# Patient Record
Sex: Male | Born: 1990 | Race: Black or African American | Hispanic: No | Marital: Single | Smoking: Never smoker
Health system: Southern US, Community
[De-identification: ages and names within clinical notes are randomized; demographics above are authoritative.]

## PROBLEM LIST (undated history)

## (undated) DIAGNOSIS — I639 Cerebral infarction, unspecified: Secondary | ICD-10-CM

## (undated) DIAGNOSIS — R569 Unspecified convulsions: Secondary | ICD-10-CM

## (undated) HISTORY — PX: BRAIN SURGERY: SHX531

---

## 2017-12-14 ENCOUNTER — Encounter (HOSPITAL_COMMUNITY): Payer: Self-pay | Admitting: *Deleted

## 2017-12-14 ENCOUNTER — Emergency Department (HOSPITAL_COMMUNITY)
Admission: EM | Admit: 2017-12-14 | Discharge: 2017-12-14 | Disposition: A | Discharge status: Home / Self Care | Payer: Self-pay | Attending: Emergency Medicine | Admitting: Emergency Medicine

## 2017-12-14 ENCOUNTER — Other Ambulatory Visit: Payer: Self-pay

## 2017-12-14 DIAGNOSIS — K0889 Other specified disorders of teeth and supporting structures: Secondary | ICD-10-CM | POA: Insufficient documentation

## 2017-12-14 HISTORY — DX: Cerebral infarction, unspecified: I63.9

## 2017-12-14 MED ORDER — PENICILLIN V POTASSIUM 250 MG PO TABS
500.0000 mg | ORAL_TABLET | Freq: Once | ORAL | Status: AC
  Administered 2017-12-14: 500 mg via ORAL
  Filled 2017-12-14: qty 2

## 2017-12-14 MED ORDER — PENICILLIN V POTASSIUM 500 MG PO TABS: 500.0000 mg | ORAL_TABLET | Freq: Four times a day (QID) | ORAL | 0 refills | Status: AC

## 2017-12-14 MED ORDER — OXYCODONE-ACETAMINOPHEN 5-325 MG PO TABS
1.0000 | ORAL_TABLET | Freq: Once | ORAL | Status: AC
  Administered 2017-12-14: 1 via ORAL
  Filled 2017-12-14: qty 1

## 2017-12-14 MED ORDER — NAPROXEN 500 MG PO TABS: 500.0000 mg | ORAL_TABLET | Freq: Two times a day (BID) | ORAL | 0 refills | Status: DC

## 2017-12-14 NOTE — ED Notes (Signed)
Pt's name called for a room x2.  No response. 

## 2017-12-14 NOTE — Discharge Instructions (Signed)
Take penicillin as directed.  Complete the entire course of this medication regardless of symptom improvement. Take naproxen as needed for pain. Follow-up with your dentist for further evaluation. Return to ED for worsening symptoms, trouble breathing, trouble swallowing, neck swelling.

## 2017-12-14 NOTE — ED Provider Notes (Signed)
[facility] EMERGENCY DEPARTMENT Provider Note   CSN: [id number] Arrival date & time: 12/14/17  1330     History   Chief Complaint Chief Complaint  Patient presents with  . Dental Pain    HPI Richard Atkins is a 27 y.o. male who presents to ED for evaluation of one-week history of left lower dental pain that has progressively worsened in the past 2 days.  Reports history of similar symptoms in the past.  He has not seen a dentist in several days.  This times the pain as sharp and worse with chewing.  He is scheduled to meet with his dentist tomorrow.  He took 1 dose of Tylenol with no improvement in his symptoms 2 days ago.  He denies any trouble breathing, trouble swallowing, facial or neck swelling, fever or drainage from site.  HPI  Past Medical History:  Diagnosis Date  . Stroke [facility])     There are no active problems to display for this patient.   Past Surgical History:  Procedure Laterality Date  . BRAIN SURGERY         Home Medications    Prior to Admission medications   Medication Sig Start Date End Date Taking? Authorizing Provider  naproxen (NAPROSYN) 500 MG tablet Take 1 tablet (500 mg total) by mouth 2 (two) times daily. 12/14/17   Hemi Chacko, PA-C  penicillin v potassium (VEETID) 500 MG tablet Take 1 tablet (500 mg total) by mouth 4 (four) times daily for 7 days. 12/14/17 12/21/17  Dietrich Pates, PA-C    Family History History reviewed. No pertinent family history.  Social History Social History   Tobacco Use  . Smoking status: Never Smoker  Substance Use Topics  . Alcohol use: No    Frequency: Never  . Drug use: Not on file     Allergies   Patient has no known allergies.   Review of Systems Review of Systems  Constitutional: Negative for chills and fever.  HENT: Positive for dental problem. Negative for congestion, drooling, facial swelling, postnasal drip, rhinorrhea, sore throat, tinnitus and trouble swallowing.    Respiratory: Negative for shortness of breath.   Musculoskeletal: Negative for neck pain and neck stiffness.  Skin: Negative for rash.     Physical Exam Updated Vital Signs BP (!) 142/94 (BP Location: Right Arm)   Pulse 69   Temp 98.4 F (36.9 C) (Oral)   Resp 18   SpO2 100%   Physical Exam  Constitutional: He appears well-developed and well-nourished. No distress.  HENT:  Head: Normocephalic and atraumatic.  Mouth/Throat: Uvula is midline. He does not have dentures. Abnormal dentition. Dental caries present. No dental abscesses. No posterior oropharyngeal edema or posterior oropharyngeal erythema.    Tenderness to palpation of the back left lower molar as indicated in the image.  Chipped tooth in the area.  No gross abscess or site of drainage at this time.  Overall poor dentition.  No facial, neck or cheek swelling noted. No pooling of secretions or trismus.  Normal voice noted with no difficulty swallowing or breathing.  No submandibular swelling, erythema or crepitus noted.  Eyes: Conjunctivae and EOM are normal. No scleral icterus.  Neck: Normal range of motion.  Pulmonary/Chest: Effort normal. No respiratory distress.  Neurological: He is alert.  Skin: No rash noted. He is not diaphoretic.  Psychiatric: He has a normal mood and affect.  Nursing note and vitals reviewed.    ED Treatments / Results  Labs (all  labs ordered are listed, but only abnormal results are displayed) Labs Reviewed - No data to display  EKG  EKG Interpretation None       Radiology No results found.  Procedures Procedures (including critical care time)  Medications Ordered in ED Medications  oxyCODONE-acetaminophen (PERCOCET/ROXICET) 5-325 MG per tablet 1 tablet (not administered)  penicillin v potassium (VEETID) tablet 500 mg (not administered)     Initial Impression / Assessment and Plan / ED Course  I have reviewed the triage vital signs and the nursing notes.  Pertinent labs  & imaging results that were available during my care of the patient were reviewed by me and considered in my medical decision making (see chart for details).     Patient presents to ED for evaluation of left lower dental pain for the past week.  Scheduled to meet with dentist tomorrow.  There is a chipped tooth in the area that is causing the pain but no gross abscess, site of drainage or signs of Ludwig's angina at this time.  He is tolerating secretions with no trismus, drooling, facial swelling, submandibular swelling or erythema.  He appears uncomfortable due to the pain.  He is afebrile here.  Will give patient anti-inflammatories, antibiotics and advised him to follow-up with his dentist tomorrow.  Patient appears stable for discharge at this time.  Strict return precautions given.  Portions of this note were generated with Scientist, clinical (histocompatibility and immunogenetics)Dragon dictation software. Dictation errors may occur despite best attempts at proofreading.  Final Clinical Impressions(s) / ED Diagnoses   Final diagnoses:  Pain, dental    ED Discharge Orders        Ordered    penicillin v potassium (VEETID) 500 MG tablet  4 times daily     12/14/17 1534    naproxen (NAPROSYN) 500 MG tablet  2 times daily     12/14/17 1534       Dietrich PatesKhatri, Vy Badley, PA-C 12/14/17 1538    Tilden Fossaees, Elizabeth, MD 12/15/17 [phone]

## 2017-12-14 NOTE — ED Triage Notes (Signed)
Pt reports severe left lower dental pain. Airway intact.

## 2018-04-27 ENCOUNTER — Encounter (HOSPITAL_COMMUNITY): Payer: Self-pay | Admitting: Emergency Medicine

## 2018-04-27 ENCOUNTER — Other Ambulatory Visit: Payer: Self-pay

## 2018-04-27 ENCOUNTER — Emergency Department (HOSPITAL_COMMUNITY)
Admission: EM | Admit: 2018-04-27 | Discharge: 2018-04-27 | Disposition: A | Discharge status: Home / Self Care | Payer: 59 | Attending: Emergency Medicine | Admitting: Emergency Medicine

## 2018-04-27 DIAGNOSIS — Z79899 Other long term (current) drug therapy: Secondary | ICD-10-CM | POA: Insufficient documentation

## 2018-04-27 DIAGNOSIS — K0889 Other specified disorders of teeth and supporting structures: Secondary | ICD-10-CM | POA: Insufficient documentation

## 2018-04-27 HISTORY — DX: Unspecified convulsions: R56.9

## 2018-04-27 MED ORDER — LIDOCAINE VISCOUS HCL 2 % MT SOLN: 15.0000 mL | OROMUCOSAL | 0 refills | Status: DC | PRN

## 2018-04-27 MED ORDER — PENICILLIN V POTASSIUM 500 MG PO TABS: 500.0000 mg | ORAL_TABLET | Freq: Four times a day (QID) | ORAL | 0 refills | Status: AC

## 2018-04-27 MED ORDER — ALBUTEROL SULFATE HFA 108 (90 BASE) MCG/ACT IN AERS: 1.0000 | INHALATION_SPRAY | Freq: Once | RESPIRATORY_TRACT | Status: DC

## 2018-04-27 NOTE — Discharge Instructions (Addendum)
Take antibiotics as prescribed.  Take the entire course, even if your symptoms improve. Use viscous lidocaine and Excedrin as needed for pain. Follow-up with a dentist regarding your dental pain.  There is information about dentists in the area attached to the paperwork. Return to the emergency room if you develop high fevers, difficulty breathing, difficulty swallowing, or any new or concerning symptoms.

## 2018-04-27 NOTE — ED Provider Notes (Signed)
[facility]-EMERGENCY DEPT Provider Note   CSN: [id number] Arrival date & time: 04/27/18  1238     History   Chief Complaint Chief Complaint  Patient presents with  . Dental Pain    HPI Richard Atkins is a 27 y.o. male presenting for evaluation of dental pain.  Patient states he has a history of issues with dental pain, does not have a dentist currently.  He reports 74-month history of lower dental pain.  It initially was on his left side, although that improved when he was seen in the ED in January.  He is currently having pain on his right lower side.  He denies fevers, chills, difficulty breathing, difficulty swallowing, chest pain, shortness of breath.  He has a history of seizures for which she takes Keppra, has not had any seizures recently.  He is not immunocompromised.  No allergies to antibiotics.  He states that when he was treated in the ER in January, he had improvement of his symptoms for several weeks.  His pain is constant, worse with eating.  Nothing has made it better.  He has been taking Excedrin without improvement of pain.  HPI  Past Medical History:  Diagnosis Date  . Seizures (HCC)   . Stroke [facility])     There are no active problems to display for this patient.   Past Surgical History:  Procedure Laterality Date  . BRAIN SURGERY          Home Medications    Prior to Admission medications   Medication Sig Start Date End Date Taking? Authorizing Provider  lidocaine (XYLOCAINE) 2 % solution Use as directed 15 mLs in the mouth or throat as needed for mouth pain. 04/27/18   Dawon Troop, PA-C  naproxen (NAPROSYN) 500 MG tablet Take 1 tablet (500 mg total) by mouth 2 (two) times daily. 12/14/17   Khatri, Hina, PA-C  penicillin v potassium (VEETID) 500 MG tablet Take 1 tablet (500 mg total) by mouth 4 (four) times daily for 7 days. 04/27/18 05/04/18  Aster Screws, PA-C    Family History No family history on  file.  Social History Social History   Tobacco Use  . Smoking status: Never Smoker  Substance Use Topics  . Alcohol use: No    Frequency: Never  . Drug use: Not on file     Allergies   Patient has no known allergies.   Review of Systems Review of Systems  Constitutional: Negative for fever.  HENT: Positive for dental problem.      Physical Exam Updated Vital Signs BP (!) 142/98 (BP Location: Left Arm)   Pulse (!) 59   Temp 98.5 F (36.9 C) (Oral)   Resp 15   SpO2 100%   Physical Exam  Constitutional: He is oriented to person, place, and time. He appears well-developed and well-nourished. No distress.  HENT:  Head: Normocephalic and atraumatic.  Right Ear: Tympanic membrane, external ear and ear canal normal.  Left Ear: Tympanic membrane, external ear and ear canal normal.  Nose: Nose normal.  Mouth/Throat: Uvula is midline, oropharynx is clear and moist and mucous membranes are normal. No trismus in the jaw. Abnormal dentition. Dental caries present. No dental abscesses or uvula swelling.    Overall poor dentition.  Multiple cracked teeth and multiple dental caries.  Right lower tooth cracked with surrounding gum erythema and edema.  Left lower tooth has a hole and mild surrounding gum edema.  No erythema.  No obvious drainable  abscess.  Tenderness palpation of both teeth.  No pain under the tongue.  No facial swelling.  Handling secretions easily.  No trismus.  Uvula midline with equal palate rise.  Eyes: EOM are normal.  Neck: Normal range of motion.  Cardiovascular: Normal rate, regular rhythm and intact distal pulses.  Pulmonary/Chest: Effort normal and breath sounds normal. No respiratory distress. He has no wheezes.  Abdominal: He exhibits no distension.  Musculoskeletal: Normal range of motion.  Neurological: He is alert and oriented to person, place, and time.  Skin: Skin is warm. No rash noted.  Psychiatric: He has a normal mood and affect.  Nursing note  and vitals reviewed.    ED Treatments / Results  Labs (all labs ordered are listed, but only abnormal results are displayed) Labs Reviewed - No data to display  EKG None  Radiology No results found.  Procedures Procedures (including critical care time)  Medications Ordered in ED Medications - No data to display   Initial Impression / Assessment and Plan / ED Course  I have reviewed the triage vital signs and the nursing notes.  Pertinent labs & imaging results that were available during my care of the patient were reviewed by me and considered in my medical decision making (see chart for details).     Patient presenting for evaluation of dental pain.  Physical exam reassuring, he is afebrile not tachycardic.  Handling secretions easily.  Oral exam shows multiple cracked teeth and overall poor dentition with multiple caries.  Surrounding gum erythema/edema.  Doubt Ludwig's.  Will treat with antibiotics, viscous lidocaine, continue Excedrin.  Given information to follow-up with dentistry.  At this time, patient appears safe for discharge.  Return precautions given.  Patient states he understands and agrees to plan.  Final Clinical Impressions(s) / ED Diagnoses   Final diagnoses:  Pain, dental    ED Discharge Orders        Ordered    penicillin v potassium (VEETID) 500 MG tablet  4 times daily     04/27/18 1338    lidocaine (XYLOCAINE) 2 % solution  As needed     04/27/18 1338       Mussa Groesbeck, PA-C 04/27/18 1421    Donnetta Hutchingook, Brian, MD 04/28/18 [phone]

## 2018-04-27 NOTE — ED Triage Notes (Signed)
Pt from home with c/o bottom right dental pain. Pt states this has been present x 2 months. Pt states he needs a referral to a dentist. Pt is not febrile nor tachycardic

## 2018-06-05 ENCOUNTER — Emergency Department (HOSPITAL_COMMUNITY)
Admission: EM | Admit: 2018-06-05 | Discharge: 2018-06-05 | Disposition: A | Discharge status: Home / Self Care | Payer: 59 | Attending: Emergency Medicine | Admitting: Emergency Medicine

## 2018-06-05 ENCOUNTER — Emergency Department (HOSPITAL_COMMUNITY): Payer: 59

## 2018-06-05 ENCOUNTER — Encounter (HOSPITAL_COMMUNITY): Payer: Self-pay | Admitting: Emergency Medicine

## 2018-06-05 DIAGNOSIS — Y939 Activity, unspecified: Secondary | ICD-10-CM | POA: Diagnosis not present

## 2018-06-05 DIAGNOSIS — Y999 Unspecified external cause status: Secondary | ICD-10-CM | POA: Insufficient documentation

## 2018-06-05 DIAGNOSIS — Y929 Unspecified place or not applicable: Secondary | ICD-10-CM | POA: Diagnosis not present

## 2018-06-05 DIAGNOSIS — S60221A Contusion of right hand, initial encounter: Secondary | ICD-10-CM | POA: Insufficient documentation

## 2018-06-05 DIAGNOSIS — Z23 Encounter for immunization: Secondary | ICD-10-CM | POA: Insufficient documentation

## 2018-06-05 DIAGNOSIS — S60921A Unspecified superficial injury of right hand, initial encounter: Secondary | ICD-10-CM | POA: Diagnosis present

## 2018-06-05 DIAGNOSIS — Z79899 Other long term (current) drug therapy: Secondary | ICD-10-CM | POA: Diagnosis not present

## 2018-06-05 DIAGNOSIS — S61411A Laceration without foreign body of right hand, initial encounter: Secondary | ICD-10-CM | POA: Diagnosis not present

## 2018-06-05 MED ORDER — TETANUS-DIPHTH-ACELL PERTUSSIS 5-2.5-18.5 LF-MCG/0.5 IM SUSP
0.5000 mL | Freq: Once | INTRAMUSCULAR | Status: AC
  Administered 2018-06-05: 0.5 mL via INTRAMUSCULAR
  Filled 2018-06-05: qty 0.5

## 2018-06-05 MED ORDER — SULFAMETHOXAZOLE-TRIMETHOPRIM 800-160 MG PO TABS: 1.0000 | ORAL_TABLET | Freq: Two times a day (BID) | ORAL | 0 refills | Status: AC

## 2018-06-05 MED ORDER — BACITRACIN-NEOMYCIN-POLYMYXIN 400-5-5000 EX OINT
TOPICAL_OINTMENT | Freq: Once | CUTANEOUS | Status: AC
  Administered 2018-06-05: 1 via TOPICAL
  Filled 2018-06-05: qty 1
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
## 2018-06-05 NOTE — Discharge Instructions (Addendum)
Take tylenol and ibuprofen as needed for pain. Return as needed for worsening symptoms.  °

## 2019-01-22 ENCOUNTER — Encounter (HOSPITAL_COMMUNITY): Payer: Self-pay

## 2019-01-22 ENCOUNTER — Other Ambulatory Visit: Payer: Self-pay

## 2019-01-22 ENCOUNTER — Emergency Department (HOSPITAL_COMMUNITY)
Admission: EM | Admit: 2019-01-22 | Discharge: 2019-01-22 | Disposition: A | Discharge status: Home / Self Care | Payer: 59 | Attending: Emergency Medicine | Admitting: Emergency Medicine

## 2019-01-22 DIAGNOSIS — R51 Headache: Secondary | ICD-10-CM | POA: Insufficient documentation

## 2019-01-22 DIAGNOSIS — Z8673 Personal history of transient ischemic attack (TIA), and cerebral infarction without residual deficits: Secondary | ICD-10-CM | POA: Insufficient documentation

## 2019-01-22 DIAGNOSIS — Z79899 Other long term (current) drug therapy: Secondary | ICD-10-CM | POA: Insufficient documentation

## 2019-01-22 DIAGNOSIS — R519 Headache, unspecified: Secondary | ICD-10-CM

## 2019-01-22 NOTE — Discharge Instructions (Addendum)
Please for any problem.  Follow-up with your regular care provider as instructed.  If you decide that you need to be seen please return to the ED for evaluation.
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
## 2019-01-22 NOTE — ED Provider Notes (Signed)
[facility]-EMERGENCY DEPT Provider Note   CSN: [id number] Arrival date & time: 01/22/19  1009    History   Chief Complaint Chief Complaint  Patient presents with  . Generalized Body Aches  . Headache    HPI Richard Atkins is a 28 y.o. male.     28 year old male with prior medical history as detailed below presents for evaluation of possible seizure activity.  Patient has a history of seizure.  He is currently on Keppra.  He reports full compliance with his medications.  Yesterday morning he woke up feeling stiff and sore.  He reports this is typically how he feels after having had a seizure.  He is concerned that he may have had a seizure overnight when he was asleep.  Patient is otherwise without complaint.  Patient reports that he has been waiting in the ED for 4 hours.  He now desires discharge.  He declines IV, labs, or imaging studies.  The history is provided by the patient and medical records.  Headache  Pain location:  Generalized Quality:  Dull Radiates to:  Does not radiate Onset quality:  Gradual Duration:  1 day Timing:  Constant Progression:  Waxing and waning Chronicity:  New Worsened by:  Nothing   Past Medical History:  Diagnosis Date  . Seizures (HCC)   . Stroke [facility])     There are no active problems to display for this patient.   Past Surgical History:  Procedure Laterality Date  . BRAIN SURGERY          Home Medications    Prior to Admission medications   Medication Sig Start Date End Date Taking? Authorizing Provider  levETIRAcetam (KEPPRA) 1000 MG tablet Take 1,000 mg by mouth 2 (two) times daily. 01/29/17   [provider]  Multiple Vitamin (MULTIVITAMIN WITH MINERALS) TABS tablet Take 1 tablet by mouth daily.    [provider]    Family History Family History  Problem Relation Age of Onset  . Healthy Mother   . Healthy Father     Social History Social History   Tobacco Use    . Smoking status: Never Smoker  . Smokeless tobacco: Never Used  Substance Use Topics  . Alcohol use: No    Frequency: Never  . Drug use: Never     Allergies   Patient has no known allergies.   Review of Systems Review of Systems  Neurological: Positive for headaches.  All other systems reviewed and are negative.    Physical Exam Updated Vital Signs BP 121/90 (BP Location: Right Arm)   Pulse (!) 53   Temp 98.3 F (36.8 C) (Oral)   Resp 20   Ht 5\' 5"  (1.651 m)   Wt 59 kg   SpO2 100%   BMI 21.63 kg/m   Physical Exam Vitals signs and nursing note reviewed.  Constitutional:      General: He is not in acute distress.    Appearance: He is well-developed.  HENT:     Head: Normocephalic and atraumatic.  Eyes:     General: No visual field deficit.    Conjunctiva/sclera: Conjunctivae normal.     Pupils: Pupils are equal, round, and reactive to light.  Neck:     Musculoskeletal: Normal range of motion and neck supple.  Cardiovascular:     Rate and Rhythm: Normal rate and regular rhythm.     Heart sounds: Normal heart sounds.  Pulmonary:     Effort: Pulmonary effort is  normal. No respiratory distress.     Breath sounds: Normal breath sounds.  Abdominal:     General: There is no distension.     Palpations: Abdomen is soft.     Tenderness: There is no abdominal tenderness.  Musculoskeletal: Normal range of motion.        General: No deformity.  Skin:    General: Skin is warm and dry.  Neurological:     Mental Status: He is alert and oriented to person, place, and time. Mental status is at baseline.     GCS: GCS eye subscore is 4. GCS verbal subscore is 5. GCS motor subscore is 6.     Cranial Nerves: No cranial nerve deficit, dysarthria or facial asymmetry.     Sensory: No sensory deficit.     Motor: No weakness.     Coordination: Romberg sign negative. Coordination normal.      ED Treatments / Results  Labs (all labs ordered are listed, but only abnormal  results are displayed) Labs Reviewed - No data to display  EKG None  Radiology No results found.  Procedures Procedures (including critical care time)  Medications Ordered in ED Medications - No data to display   Initial Impression / Assessment and Plan / ED Course  I have reviewed the triage vital signs and the nursing notes.  Pertinent labs & imaging results that were available during my care of the patient were reviewed by me and considered in my medical decision making (see chart for details).        MDM  Screen complete  Patient is presenting for evaluation following possible seizure activity.  Patient's exam is benign.  Patient refuses IV, labs, or CT imaging.  He reports that he has been waiting long enough already (wait time today is about 4 hours).  He declines further work-up.  Patient does understand the need for close follow-up.  Strict return precautions given and understood.  Final Clinical Impressions(s) / ED Diagnoses   Final diagnoses:  Acute nonintractable headache, unspecified headache type    ED Discharge Orders    None       Wynetta Fines, MD 01/22/19 1501
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
## 2020-02-03 ENCOUNTER — Emergency Department (HOSPITAL_COMMUNITY)
Admission: EM | Admit: 2020-02-03 | Discharge: 2020-02-03 | Disposition: A | Discharge status: Home / Self Care | Payer: 59 | Attending: Emergency Medicine | Admitting: Emergency Medicine

## 2020-02-03 ENCOUNTER — Encounter (HOSPITAL_COMMUNITY): Payer: Self-pay | Admitting: Emergency Medicine

## 2020-02-03 ENCOUNTER — Other Ambulatory Visit: Payer: Self-pay

## 2020-02-03 DIAGNOSIS — Z79899 Other long term (current) drug therapy: Secondary | ICD-10-CM | POA: Insufficient documentation

## 2020-02-03 DIAGNOSIS — R300 Dysuria: Secondary | ICD-10-CM | POA: Diagnosis present

## 2020-02-03 LAB — URINALYSIS, ROUTINE W REFLEX MICROSCOPIC
Bacteria, UA: NONE SEEN
Bilirubin Urine: NEGATIVE
Glucose, UA: NEGATIVE mg/dL
Hgb urine dipstick: NEGATIVE
Ketones, ur: NEGATIVE mg/dL
Leukocytes,Ua: NEGATIVE
Nitrite: NEGATIVE
Protein, ur: 30 mg/dL — AB
Specific Gravity, Urine: 1.019 (ref 1.005–1.030)
pH: 7 (ref 5.0–8.0)

## 2020-02-03 MED ORDER — DOXYCYCLINE HYCLATE 100 MG PO TABS
100.0000 mg | ORAL_TABLET | Freq: Once | ORAL | Status: AC
Start: 2020-02-03 — End: 2020-02-03
  Administered 2020-02-03: 20:00:00 100 mg via ORAL
  Filled 2020-02-03: qty 1

## 2020-02-03 MED ORDER — DOXYCYCLINE HYCLATE 100 MG PO CAPS: 100.0000 mg | ORAL_CAPSULE | Freq: Two times a day (BID) | ORAL | 0 refills | Status: AC

## 2020-02-03 MED ORDER — CEFTRIAXONE SODIUM 1 G IJ SOLR
500.0000 mg | Freq: Once | INTRAMUSCULAR | Status: AC
  Administered 2020-02-03: 500 mg via INTRAMUSCULAR
  Filled 2020-02-03: qty 10

## 2020-02-03 MED ORDER — STERILE WATER FOR INJECTION IJ SOLN
INTRAMUSCULAR | Status: AC
  Administered 2020-02-03: 20:00:00 0.5 mL
  Filled 2020-02-03: qty 10

## 2020-02-03 MED ORDER — PHENAZOPYRIDINE HCL 200 MG PO TABS: 200.0000 mg | ORAL_TABLET | Freq: Three times a day (TID) | ORAL | 0 refills | Status: AC

## 2020-02-03 NOTE — ED Triage Notes (Signed)
Patient here from home with complaints of burning when urinating. Also report irration around penis. Denis STDs.

## 2020-02-03 NOTE — Discharge Instructions (Addendum)
Return for new or worsening symptoms. Safe sex

## 2020-02-03 NOTE — ED Provider Notes (Signed)
[facility]-EMERGENCY DEPT Provider Note   CSN: [id number] Arrival date & time: 02/03/20  1842    History Chief Complaint  Patient presents with  . Dysuria    Richard Atkins is a 29 y.o. male with no history significant for CVA, seizures who presents for evaluation of dysuria.  Patient has had some burning with urination over the last 2 days.  Has some burning to the tip of his penis.  He denies any pain all discharge, rashes or lesions.  He has been taking Azo at home with mild relief.  Denies fever, chills, nausea,, chest pain, shortness of breath, abdominal pain, pain with bowel movements.  Denies additional aggravating or alleviating factors.  Rates his pain at a burning sensation.  Rated a 5/10.    History obtained from patient and past medical record.  No interpreter is used.  HPI     Past Medical History:  Diagnosis Date  . Seizures (HCC)   . Stroke [facility])     There are no problems to display for this patient.   Past Surgical History:  Procedure Laterality Date  . BRAIN SURGERY         Family History  Problem Relation Age of Onset  . Healthy Mother   . Healthy Father     Social History   Tobacco Use  . Smoking status: Never Smoker  . Smokeless tobacco: Never Used  Substance Use Topics  . Alcohol use: No  . Drug use: Never    Home Medications Prior to Admission medications   Medication Sig Start Date End Date Taking? Authorizing Provider  doxycycline (VIBRAMYCIN) 100 MG capsule Take 1 capsule (100 mg total) by mouth 2 (two) times daily. 02/03/20   Wess Baney A, PA-C  levETIRAcetam (KEPPRA) 1000 MG tablet Take 1,000 mg by mouth 2 (two) times daily. 01/29/17   [provider]  Multiple Vitamin (MULTIVITAMIN WITH MINERALS) TABS tablet Take 1 tablet by mouth daily.    [provider]  phenazopyridine (PYRIDIUM) 200 MG tablet Take 1 tablet (200 mg total) by mouth 3 (three) times daily. 02/03/20   Lee Kalt,  Karely Hurtado A, PA-C    Allergies    Patient has no known allergies.  Review of Systems   Review of Systems  Constitutional: Negative.   HENT: Negative.   Respiratory: Negative.   Cardiovascular: Negative.   Gastrointestinal: Negative.   Genitourinary: Positive for dysuria. Negative for decreased urine volume, difficulty urinating, discharge, flank pain, frequency, genital sores, hematuria, penile pain, penile swelling, scrotal swelling, testicular pain and urgency.  Musculoskeletal: Negative.   Skin: Negative.   Neurological: Negative.   All other systems reviewed and are negative.   Physical Exam Updated Vital Signs BP (!) 134/91 (BP Location: Left Arm)   Pulse 71   Temp 98 F (36.7 C) (Oral)   Resp 18   SpO2 97%   Physical Exam Vitals and nursing note reviewed. Exam conducted with a chaperone present.  Constitutional:      General: He is not in acute distress.    Appearance: He is well-developed. He is not ill-appearing, toxic-appearing or diaphoretic.  HENT:     Head: Normocephalic and atraumatic.     Nose: Nose normal.     Mouth/Throat:     Mouth: Mucous membranes are moist.     Pharynx: Oropharynx is clear.  Eyes:     Pupils: Pupils are equal, round, and reactive to light.  Cardiovascular:     Rate and Rhythm:  Normal rate and regular rhythm.     Pulses: Normal pulses.     Heart sounds: Normal heart sounds.  Pulmonary:     Effort: Pulmonary effort is normal. No respiratory distress.     Breath sounds: Normal breath sounds.  Abdominal:     General: Bowel sounds are normal. There is no distension.     Palpations: Abdomen is soft. There is no mass.     Tenderness: There is no abdominal tenderness. There is no right CVA tenderness, left CVA tenderness, guarding or rebound.     Hernia: No hernia is present.  Genitourinary:    Penis: Normal.      Testes: Normal. Cremasteric reflex is present.     Epididymis:     Right: Normal.     Left: Normal.     Comments: No  rashes or lesions.  Reflex present.  No tenderness over epididymis.  No testicular redness, swelling or warmth. Musculoskeletal:        General: Normal range of motion.     Cervical back: Normal range of motion and neck supple.  Skin:    General: Skin is warm and dry.  Neurological:     Mental Status: He is alert.     ED Results / Procedures / Treatments   Labs (all labs ordered are listed, but only abnormal results are displayed) Labs Reviewed  URINALYSIS, ROUTINE W REFLEX MICROSCOPIC - Abnormal; Notable for the following components:      Result Value   Protein, ur 30 (*)    All other components within normal limits  GC/CHLAMYDIA PROBE AMP ([place]) NOT AT [facility]    EKG None  Radiology No results found.  Procedures Procedures (including critical care time)  Medications Ordered in ED Medications  cefTRIAXone (ROCEPHIN) injection 500 mg (has no administration in time range)  doxycycline (VIBRA-TABS) tablet 100 mg (has no administration in time range)  sterile water (preservative free) injection (has no administration in time range)    ED Course  I have reviewed the triage vital signs and the nursing notes.  Pertinent labs & imaging results that were available during my care of the patient were reviewed by me and considered in my medical decision making (see chart for details).  30 year old presents for evaluation of dysuria.  He is afebrile, nonseptic, non-ill-appearing.  GU exam unremarkable.  No pain with bowel movements.  Urinalysis negative for infection. NO flank pain, abd pain, hematuria to suspect sonte  Patient is afebrile without abdominal tenderness, abdominal pain or painful bowel movements to indicate prostatitis.  No tenderness to palpation of the testes or epididymis to suggest orchitis or epididymitis.  STD cultures obtained including  gonorrhea and chlamydia. Declined HIV, RPR. Patient to be discharged with instructions to follow up with PCP. Discussed  importance of using protection when sexually active. Pt understands that they have GC/Chlamydia cultures pending and that they will need to inform all sexual partners if results return positive. Patient has been treated prophylactically with Doxy and Rocephin. Will return for new or worsening symptoms.  The patient has been appropriately medically screened and/or stabilized in the ED. I have low suspicion for any other emergent medical condition which would require further screening, evaluation or treatment in the ED or require inpatient management.    MDM Rules/Calculators/A&P                       Final Clinical Impression(s) / ED Diagnoses Final diagnoses:  Dysuria  Rx / DC Orders ED Discharge Orders         Ordered    doxycycline (VIBRAMYCIN) 100 MG capsule  2 times daily     02/03/20 1953    phenazopyridine (PYRIDIUM) 200 MG tablet  3 times daily     02/03/20 1953           Yosiah Jasmin A, PA-C 02/03/20 2007    Mancel Bale, MD 02/04/20 2325

## 2020-02-05 LAB — GC/CHLAMYDIA PROBE AMP (~~LOC~~) NOT AT ARMC
Chlamydia: NEGATIVE
Neisseria Gonorrhea: NEGATIVE

## 2020-03-04 ENCOUNTER — Emergency Department (HOSPITAL_COMMUNITY)
Admission: EM | Admit: 2020-03-04 | Discharge: 2020-03-04 | Disposition: A | Discharge status: Home / Self Care | Payer: 59 | Attending: Emergency Medicine | Admitting: Emergency Medicine

## 2020-03-04 ENCOUNTER — Encounter (HOSPITAL_COMMUNITY): Payer: Self-pay | Admitting: Emergency Medicine

## 2020-03-04 DIAGNOSIS — Z8673 Personal history of transient ischemic attack (TIA), and cerebral infarction without residual deficits: Secondary | ICD-10-CM | POA: Insufficient documentation

## 2020-03-04 DIAGNOSIS — R569 Unspecified convulsions: Secondary | ICD-10-CM | POA: Insufficient documentation

## 2020-03-04 DIAGNOSIS — Z79899 Other long term (current) drug therapy: Secondary | ICD-10-CM | POA: Diagnosis not present

## 2020-03-04 DIAGNOSIS — Z760 Encounter for issue of repeat prescription: Secondary | ICD-10-CM | POA: Diagnosis present

## 2020-03-04 MED ORDER — LEVETIRACETAM 500 MG PO TABS
1500.0000 mg | ORAL_TABLET | Freq: Once | ORAL | Status: AC
  Administered 2020-03-04: 10:00:00 1500 mg via ORAL
  Filled 2020-03-04: qty 3

## 2020-03-04 MED ORDER — LEVETIRACETAM 1000 MG PO TABS: 1000.0000 mg | ORAL_TABLET | Freq: Two times a day (BID) | ORAL | 0 refills | Status: AC

## 2020-03-04 NOTE — ED Triage Notes (Signed)
Per pt, states he is out of his seizure med-his MD is out of town-had a seizure yesterday

## 2020-03-04 NOTE — Discharge Instructions (Addendum)
Seizure Medications sent to [facility]. Please have your primary doctor or neurologist help with continued medication management.  [place] law prevents people with seizures or fainting from driving or operating dangerous machinery until they are free of seizures or fainting for 6 months.

## 2020-03-04 NOTE — ED Provider Notes (Signed)
[facility]-EMERGENCY DEPT Provider Note   CSN: [id number] Arrival date & time: 03/04/20  9983     History Chief Complaint  Patient presents with  . Medication Refill    Richard Atkins is a 29 y.o. male.  HPI    29 y/o patient comes in with cc of medication refill. Pt has hx of seizures and stroke. He had a seizure yday. He is out of his seizure meds. He ran out yday. He contacted his neurologist, but they are out of town. No other complaints. Denies any new headaches, neck pain or infection like symptoms.   Past Medical History:  Diagnosis Date  . Seizures (HCC)   . Stroke [facility])     There are no problems to display for this patient.   Past Surgical History:  Procedure Laterality Date  . BRAIN SURGERY         Family History  Problem Relation Age of Onset  . Healthy Mother   . Healthy Father     Social History   Tobacco Use  . Smoking status: Never Smoker  . Smokeless tobacco: Never Used  Substance Use Topics  . Alcohol use: No  . Drug use: Never    Home Medications Prior to Admission medications   Medication Sig Start Date End Date Taking? Authorizing Provider  doxycycline (VIBRAMYCIN) 100 MG capsule Take 1 capsule (100 mg total) by mouth 2 (two) times daily. 02/03/20   Henderly, Britni A, PA-C  levETIRAcetam (KEPPRA) 1000 MG tablet Take 1 tablet (1,000 mg total) by mouth 2 (two) times daily. 03/04/20   Derwood Kaplan, MD  Multiple Vitamin (MULTIVITAMIN WITH MINERALS) TABS tablet Take 1 tablet by mouth daily.    [provider]  phenazopyridine (PYRIDIUM) 200 MG tablet Take 1 tablet (200 mg total) by mouth 3 (three) times daily. 02/03/20   Henderly, Britni A, PA-C    Allergies    Patient has no known allergies.  Review of Systems   Review of Systems  Constitutional: Positive for activity change.  Gastrointestinal: Negative for nausea and vomiting.  Allergic/Immunologic: Negative for immunocompromised state.    Neurological: Positive for seizures.    Physical Exam Updated Vital Signs BP (!) 137/96 (BP Location: Left Arm)   Pulse 65   Temp 98.3 F (36.8 C) (Oral)   Resp 16   SpO2 100%   Physical Exam Vitals and nursing note reviewed.  Constitutional:      Appearance: He is well-developed.  HENT:     Head: Atraumatic.  Cardiovascular:     Rate and Rhythm: Normal rate.  Pulmonary:     Effort: Pulmonary effort is normal.  Musculoskeletal:     Cervical back: Neck supple.  Skin:    General: Skin is warm.  Neurological:     Mental Status: He is alert and oriented to person, place, and time.     ED Results / Procedures / Treatments   Labs (all labs ordered are listed, but only abnormal results are displayed) Labs Reviewed - No data to display  EKG None  Radiology No results found.  Procedures Procedures (including critical care time)  Medications Ordered in ED Medications  levETIRAcetam (KEPPRA) tablet 1,500 mg (has no administration in time range)    ED Course  I have reviewed the triage vital signs and the nursing notes.  Pertinent labs & imaging results that were available during my care of the patient were reviewed by me and considered in my medical decision making (see  chart for details).    MDM Rules/Calculators/A&P                      Pt will be given refill for his epilepsy, No acute medical workup needed as he is asymptomatic and there is no underlying infection/neurovascular dz at the the moment.  Final Clinical Impression(s) / ED Diagnoses Final diagnoses:  Encounter for medication refill  Seizure ([place])    Rx / DC Orders ED Discharge Orders         Ordered    levETIRAcetam (KEPPRA) 1000 MG tablet  2 times daily     03/04/20 1009           Varney Biles, MD 03/04/20 1012
